# Patient Record
Sex: Female | Born: 1995 | Race: Black or African American | Hispanic: No | Marital: Single | Smoking: Former smoker
Health system: Southern US, Community
[De-identification: ages and names within clinical notes are randomized; demographics above are authoritative.]

## PROBLEM LIST (undated history)

## (undated) DIAGNOSIS — F419 Anxiety disorder, unspecified: Secondary | ICD-10-CM

## (undated) DIAGNOSIS — H409 Unspecified glaucoma: Secondary | ICD-10-CM

## (undated) DIAGNOSIS — F32A Depression, unspecified: Secondary | ICD-10-CM

## (undated) DIAGNOSIS — F329 Major depressive disorder, single episode, unspecified: Secondary | ICD-10-CM

## (undated) HISTORY — PX: OTHER SURGICAL HISTORY: SHX169

---
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
## 2014-01-25 ENCOUNTER — Encounter (HOSPITAL_COMMUNITY): Payer: Self-pay | Admitting: Emergency Medicine

## 2014-01-25 DIAGNOSIS — B9689 Other specified bacterial agents as the cause of diseases classified elsewhere: Secondary | ICD-10-CM | POA: Insufficient documentation

## 2014-01-25 DIAGNOSIS — A499 Bacterial infection, unspecified: Secondary | ICD-10-CM | POA: Insufficient documentation

## 2014-01-25 DIAGNOSIS — J309 Allergic rhinitis, unspecified: Secondary | ICD-10-CM | POA: Insufficient documentation

## 2014-01-25 DIAGNOSIS — N760 Acute vaginitis: Secondary | ICD-10-CM | POA: Insufficient documentation

## 2014-01-25 DIAGNOSIS — Z87891 Personal history of nicotine dependence: Secondary | ICD-10-CM | POA: Insufficient documentation

## 2014-01-25 DIAGNOSIS — Z8669 Personal history of other diseases of the nervous system and sense organs: Secondary | ICD-10-CM | POA: Diagnosis not present

## 2014-01-25 DIAGNOSIS — R300 Dysuria: Secondary | ICD-10-CM | POA: Diagnosis present

## 2014-01-25 LAB — URINE MICROSCOPIC-ADD ON

## 2014-01-25 LAB — URINALYSIS, ROUTINE W REFLEX MICROSCOPIC
BILIRUBIN URINE: NEGATIVE
GLUCOSE, UA: NEGATIVE mg/dL
KETONES UR: NEGATIVE mg/dL
Nitrite: NEGATIVE
PROTEIN: NEGATIVE mg/dL
Specific Gravity, Urine: 1.011 (ref 1.005–1.030)
Urobilinogen, UA: 0.2 mg/dL (ref 0.0–1.0)
pH: 6.5 (ref 5.0–8.0)

## 2014-01-25 NOTE — ED Notes (Signed)
Pt reports facial swelling since Monday. States Monday she had swelling to lips that resolved on its own, today she awoke with swelling to right eye. Pt denies any swelling to tongue or throat. Pt in NAD. Speaks in complete sentences. Pt also reports that she has had urinary frequency and dysuria x 4 days. Denies hematuria. NAD. AO x4.

## 2014-01-26 ENCOUNTER — Emergency Department (HOSPITAL_COMMUNITY)
Admission: EM | Admit: 2014-01-26 | Discharge: 2014-01-26 | Disposition: A | Discharge status: Home / Self Care | Payer: Medicaid Other | Attending: Emergency Medicine | Admitting: Emergency Medicine

## 2014-01-26 DIAGNOSIS — N760 Acute vaginitis: Secondary | ICD-10-CM

## 2014-01-26 DIAGNOSIS — J302 Other seasonal allergic rhinitis: Secondary | ICD-10-CM

## 2014-01-26 MED ORDER — METRONIDAZOLE 500 MG PO TABS: 500.0000 mg | ORAL_TABLET | Freq: Three times a day (TID) | ORAL | Status: DC

## 2014-01-26 MED ORDER — CETIRIZINE HCL 10 MG PO TABS: 10.0000 mg | ORAL_TABLET | Freq: Every day | ORAL | Status: DC

## 2014-01-26 MED ORDER — FLUCONAZOLE 150 MG PO TABS: 150.0000 mg | ORAL_TABLET | Freq: Once | ORAL | Status: DC

## 2014-01-26 MED ORDER — DOXYCYCLINE HYCLATE 100 MG PO CAPS: 100.0000 mg | ORAL_CAPSULE | Freq: Two times a day (BID) | ORAL | Status: DC

## 2014-01-26 NOTE — ED Provider Notes (Signed)
CSN: [id number]     Arrival date & time 01/25/14  1954 History   First MD Initiated Contact with Patient 01/26/14 0012     Chief Complaint  Patient presents with  . Facial Swelling  . Dysuria     (Consider location/radiation/quality/duration/timing/severity/associated sxs/prior Treatment) HPI  Charlotte Douglas is a 18 y.o. female she is here because of swelling in her right thigh and lips, that started several days ago. He feels like her typical allergic reaction. She has not tried anything for it yet. She also reports losing her prescriptions for vaginitis she was given last week. She has recently moved to [place]. She denies fever, chills, nausea, vomiting, cough, shortness of breath, chest pain, or weakness. There are no other known modifying factors.   Past Medical History  Diagnosis Date  . Glaucoma    Past Surgical History  Procedure Laterality Date  . Wisdom teeth removal     No family history on file. History  Substance Use Topics  . Smoking status: Former Smoker    Quit date: 06/30/2013  . Smokeless tobacco: Not on file  . Alcohol Use: No   OB History   Grav Para Term Preterm Abortions TAB SAB Ect Mult Living                 Review of Systems  All other systems reviewed and are negative.     Allergies  Review of patient's allergies indicates no known allergies.  Home Medications   Prior to Admission medications   Medication Sig Start Date End Date Taking? Authorizing Provider  cetirizine (ZYRTEC ALLERGY) 10 MG tablet Take 1 tablet (10 mg total) by mouth daily. For allergies 01/26/14   Flint Melter, MD  doxycycline (VIBRAMYCIN) 100 MG capsule Take 1 capsule (100 mg total) by mouth 2 (two) times daily. 01/26/14   Flint Melter, MD  fluconazole (DIFLUCAN) 150 MG tablet Take 1 tablet (150 mg total) by mouth once. 01/26/14   Flint Melter, MD  metroNIDAZOLE (FLAGYL) 500 MG tablet Take 1 tablet (500 mg total) by mouth 3 (three) times daily. 01/26/14    Flint Melter, MD   BP 109/62  Pulse 98  Temp(Src) 99.2 F (37.3 C) (Oral)  Resp 18  SpO2 99%  LMP 01/11/2014 Physical Exam  Nursing note and vitals reviewed. Constitutional: She is oriented to person, place, and time. She appears well-developed and well-nourished.  HENT:  Head: Normocephalic and atraumatic.  No oral or lingual angioedema  Eyes: Conjunctivae and EOM are normal. Pupils are equal, round, and reactive to light.  Mild right eye redness without discharge  Neck: Normal range of motion and phonation normal. Neck supple.  Cardiovascular: Normal rate, regular rhythm and intact distal pulses.   Pulmonary/Chest: Effort normal and breath sounds normal. She exhibits no tenderness.  Abdominal: Soft. She exhibits no distension. There is no tenderness. There is no guarding.  Musculoskeletal: Normal range of motion.  Neurological: She is alert and oriented to person, place, and time. She exhibits normal muscle tone.  Skin: Skin is warm and dry.  Mild angioedema of the right periorbital region.  Psychiatric: She has a normal mood and affect. Her behavior is normal. Judgment and thought content normal.    ED Course  Procedures (including critical care time) Labs Review Labs Reviewed  URINALYSIS, ROUTINE W REFLEX MICROSCOPIC - Abnormal; Notable for the following:    APPearance CLOUDY (*)    Hgb urine dipstick TRACE (*)    Leukocytes, UA  TRACE (*)    All other components within normal limits  URINE MICROSCOPIC-ADD ON - Abnormal; Notable for the following:    Squamous Epithelial / LPF MANY (*)    All other components within normal limits    Imaging Review No results found.   EKG Interpretation None      MDM   Final diagnoses:  Seasonal allergic reaction  Vaginitis    Evaluation is consistent with seasonal allergies. She also is treated for vaginitis, since she lost her prescriptions.  Nursing Notes Reviewed/ Care Coordinated Applicable Imaging  Reviewed Interpretation of Laboratory Data incorporated into ED treatment  The patient appears reasonably screened and/or stabilized for discharge and I doubt any other medical condition or other [facility] requiring further screening, evaluation, or treatment in the ED at this time prior to discharge.  Plan: Home Medications- Zyrtec, Diflucan, Doxycycline, Flagyl; Home Treatments- no Sae; return here if the recommended treatment, does not improve the symptoms; Recommended follow up- PCP prn     Flint MelterElliott L Darlis Wragg, MD 01/26/14 [phone]

## 2014-01-26 NOTE — ED Notes (Signed)
Pt. Right eye and right side of face swollen. Denies SOB or difficulty breathing. Airway intact. Alert and oriented x4.

## 2014-01-26 NOTE — Discharge Instructions (Signed)
Allergies  Allergies may happen from anything your body is sensitive to. This may be food, medicines, pollens, chemicals, and many other things. Food allergies can be severe and deadly.  HOME CARE  If you do not know what causes a reaction, keep a diary. Write down the foods you ate and the symptoms that followed. Avoid foods that cause reactions.  If you have red raised spots (hives) or a rash:  Take medicine as told by your doctor.  Use medicines for red raised spots and itching as needed.  Apply cold cloths (compresses) to the skin. Take a cool bath. Avoid hot baths or showers.  If you are severely allergic:  It is often necessary to go to the hospital after you have treated your reaction.  Wear your medical alert jewelry.  You and your family must learn how to give a allergy shot or use an allergy kit (anaphylaxis kit).  Always carry your allergy kit or shot with you. Use this medicine as told by your doctor if a severe reaction is occurring. GET HELP RIGHT AWAY IF:  You have trouble breathing or are making high-pitched whistling sounds (wheezing).  You have a tight feeling in your chest or throat.  You have a puffy (swollen) mouth.  You have red raised spots, puffiness (swelling), or itching all over your body.  You have had a severe reaction that was helped by your allergy kit or shot. The reaction can return once the medicine has worn off.  You think you are having a food allergy. Symptoms most often happen within 30 minutes of eating a food.  Your symptoms have not gone away within 2 days or are getting worse.  You have new symptoms.  You want to retest yourself with a food or drink you think causes an allergic reaction. Only do this under the care of a doctor. MAKE SURE YOU:   Understand these instructions.  Will watch your condition.  Will get help right away if you are not doing well or get worse. Document Released: 09/19/2012 Document Reviewed:  09/19/2012 [facility] Patient Information 2015 [place]. This information is not intended to replace advice given to you by your health care provider. Make sure you discuss any questions you have with your health care provider.  Vaginitis Vaginitis is an inflammation of the vagina. It is most often caused by a change in the normal balance of the bacteria and yeast that live in the vagina. This change in balance causes an overgrowth of certain bacteria or yeast, which causes the inflammation. There are different types of vaginitis, but the most common types are:  Bacterial vaginosis.  Yeast infection (candidiasis).  Trichomoniasis vaginitis. This is a sexually transmitted infection (STI).  Viral vaginitis.  Atropic vaginitis.  Allergic vaginitis. CAUSES  The cause depends on the type of vaginitis. Vaginitis can be caused by:  Bacteria (bacterial vaginosis).  Yeast (yeast infection).  A parasite (trichomoniasis vaginitis)  A virus (viral vaginitis).  Low hormone levels (atrophic vaginitis). Low hormone levels can occur during pregnancy, breastfeeding, or after menopause.  Irritants, such as bubble baths, scented tampons, and feminine sprays (allergic vaginitis). Other factors can change the normal balance of the yeast and bacteria that live in the vagina. These include:  Antibiotic medicines.  Poor hygiene.  Diaphragms, vaginal sponges, spermicides, birth control pills, and intrauterine devices (IUD).  Sexual intercourse.  Infection.  Uncontrolled diabetes.  A weakened immune system. SYMPTOMS  Symptoms can vary depending on the cause of the  vaginitis. Common symptoms include:  Abnormal vaginal discharge.  The discharge is white, gray, or yellow with bacterial vaginosis.  The discharge is thick, white, and cheesy with a yeast infection.  The discharge is frothy and yellow or greenish with trichomoniasis.  A bad vaginal odor.  The odor is fishy with  bacterial vaginosis.  Vaginal itching, pain, or swelling.  Painful intercourse.  Pain or burning when urinating. Sometimes, there are no symptoms. TREATMENT  Treatment will vary depending on the type of infection.   Bacterial vaginosis and trichomoniasis are often treated with antibiotic creams or pills.  Yeast infections are often treated with antifungal medicines, such as vaginal creams or suppositories.  Viral vaginitis has no cure, but symptoms can be treated with medicines that relieve discomfort. Your sexual partner should be treated as well.  Atrophic vaginitis may be treated with an estrogen cream, pill, suppository, or vaginal ring. If vaginal dryness occurs, lubricants and moisturizing creams may help. You may be told to avoid scented soaps, sprays, or douches.  Allergic vaginitis treatment involves quitting the use of the product that is causing the problem. Vaginal creams can be used to treat the symptoms. HOME CARE INSTRUCTIONS   Take all medicines as directed by your caregiver.  Keep your genital area clean and dry. Avoid soap and only rinse the area with water.  Avoid douching. It can remove the healthy bacteria in the vagina.  Do not use tampons or have sexual intercourse until your vaginitis has been treated. Use sanitary pads while you have vaginitis.  Wipe from front to back. This avoids the spread of bacteria from the rectum to the vagina.  Let air reach your genital area.  Wear cotton underwear to decrease moisture buildup.  Avoid wearing underwear while you sleep until your vaginitis is gone.  Avoid tight pants and underwear or nylons without a cotton panel.  Take off wet clothing (especially bathing suits) as soon as possible.  Use mild, non-scented products. Avoid using irritants, such as:  Scented feminine sprays.  Fabric softeners.  Scented detergents.  Scented tampons.  Scented soaps or bubble baths.  Practice safe sex and use condoms.  Condoms may prevent the spread of trichomoniasis and viral vaginitis. SEEK MEDICAL CARE IF:   You have abdominal pain.  You have a fever or persistent symptoms for more than 2-3 days.  You have a fever and your symptoms suddenly get worse. Document Released: 03/22/2007 Document Revised: 02/17/2012 Document Reviewed: 11/05/2011 [facility] Patient Information 2015 [place], [place]. This information is not intended to replace advice given to you by your health care provider. Make sure you discuss any questions you have with your health care provider.

## 2014-04-04 ENCOUNTER — Emergency Department (HOSPITAL_COMMUNITY)
Admission: EM | Admit: 2014-04-04 | Discharge: 2014-04-04 | Disposition: A | Discharge status: Home / Self Care | Payer: Medicaid Other | Attending: Emergency Medicine | Admitting: Emergency Medicine

## 2014-04-04 ENCOUNTER — Encounter (HOSPITAL_COMMUNITY): Payer: Self-pay | Admitting: Emergency Medicine

## 2014-04-04 DIAGNOSIS — F419 Anxiety disorder, unspecified: Secondary | ICD-10-CM | POA: Diagnosis not present

## 2014-04-04 DIAGNOSIS — R112 Nausea with vomiting, unspecified: Secondary | ICD-10-CM | POA: Diagnosis present

## 2014-04-04 DIAGNOSIS — F329 Major depressive disorder, single episode, unspecified: Secondary | ICD-10-CM | POA: Insufficient documentation

## 2014-04-04 DIAGNOSIS — Z79899 Other long term (current) drug therapy: Secondary | ICD-10-CM | POA: Diagnosis not present

## 2014-04-04 DIAGNOSIS — Z8669 Personal history of other diseases of the nervous system and sense organs: Secondary | ICD-10-CM | POA: Diagnosis not present

## 2014-04-04 DIAGNOSIS — Z3202 Encounter for pregnancy test, result negative: Secondary | ICD-10-CM | POA: Diagnosis not present

## 2014-04-04 DIAGNOSIS — Z87891 Personal history of nicotine dependence: Secondary | ICD-10-CM | POA: Diagnosis not present

## 2014-04-04 DIAGNOSIS — N12 Tubulo-interstitial nephritis, not specified as acute or chronic: Secondary | ICD-10-CM | POA: Insufficient documentation

## 2014-04-04 HISTORY — DX: Depression, unspecified: F32.A

## 2014-04-04 HISTORY — DX: Anxiety disorder, unspecified: F41.9

## 2014-04-04 HISTORY — DX: Major depressive disorder, single episode, unspecified: F32.9

## 2014-04-04 LAB — CBC WITH DIFFERENTIAL/PLATELET
BASOS ABS: 0 10*3/uL (ref 0.0–0.1)
BASOS PCT: 0 % (ref 0–1)
Eosinophils Absolute: 0 10*3/uL (ref 0.0–0.7)
Eosinophils Relative: 0 % (ref 0–5)
HEMATOCRIT: 36.3 % (ref 36.0–46.0)
HEMOGLOBIN: 12.6 g/dL (ref 12.0–15.0)
Lymphocytes Relative: 10 % — ABNORMAL LOW (ref 12–46)
Lymphs Abs: 1.5 10*3/uL (ref 0.7–4.0)
MCH: 25.7 pg — ABNORMAL LOW (ref 26.0–34.0)
MCHC: 34.7 g/dL (ref 30.0–36.0)
MCV: 73.9 fL — AB (ref 78.0–100.0)
MONOS PCT: 8 % (ref 3–12)
Monocytes Absolute: 1.2 10*3/uL — ABNORMAL HIGH (ref 0.1–1.0)
NEUTROS ABS: 12.6 10*3/uL — AB (ref 1.7–7.7)
Neutrophils Relative %: 82 % — ABNORMAL HIGH (ref 43–77)
Platelets: 392 10*3/uL (ref 150–400)
RBC: 4.91 MIL/uL (ref 3.87–5.11)
RDW: 14.8 % (ref 11.5–15.5)
WBC: 15.3 10*3/uL — AB (ref 4.0–10.5)

## 2014-04-04 LAB — LIPASE, BLOOD: Lipase: 18 U/L (ref 11–59)

## 2014-04-04 LAB — COMPREHENSIVE METABOLIC PANEL
ALBUMIN: 3.5 g/dL (ref 3.5–5.2)
ALT: 17 U/L (ref 0–35)
ANION GAP: 11 (ref 5–15)
AST: 16 U/L (ref 0–37)
Alkaline Phosphatase: 99 U/L (ref 39–117)
BILIRUBIN TOTAL: 0.8 mg/dL (ref 0.3–1.2)
BUN: 5 mg/dL — AB (ref 6–23)
CHLORIDE: 99 meq/L (ref 96–112)
CO2: 25 mEq/L (ref 19–32)
CREATININE: 0.71 mg/dL (ref 0.50–1.10)
Calcium: 9.3 mg/dL (ref 8.4–10.5)
GFR calc Af Amer: >90 mL/min (ref >90)
GFR calc non Af Amer: >90 mL/min (ref >90)
Glucose, Bld: 109 mg/dL — ABNORMAL HIGH (ref 70–99)
Potassium: 4.1 mEq/L (ref 3.7–5.3)
Sodium: 135 mEq/L — ABNORMAL LOW (ref 137–147)
TOTAL PROTEIN: 7.6 g/dL (ref 6.0–8.3)

## 2014-04-04 LAB — POC URINE PREG, ED: PREG TEST UR: NEGATIVE

## 2014-04-04 LAB — URINALYSIS, ROUTINE W REFLEX MICROSCOPIC
Glucose, UA: NEGATIVE mg/dL
KETONES UR: NEGATIVE mg/dL
NITRITE: POSITIVE — AB
Protein, ur: 30 mg/dL — AB
Specific Gravity, Urine: 1.024 (ref 1.005–1.030)
UROBILINOGEN UA: 1 mg/dL (ref 0.0–1.0)
pH: 8 (ref 5.0–8.0)

## 2014-04-04 LAB — URINE MICROSCOPIC-ADD ON

## 2014-04-04 MED ORDER — MORPHINE SULFATE 4 MG/ML IJ SOLN
4.0000 mg | Freq: Once | INTRAMUSCULAR | Status: AC
  Administered 2014-04-04: 4 mg via INTRAVENOUS
  Filled 2014-04-04: qty 1

## 2014-04-04 MED ORDER — SODIUM CHLORIDE 0.9 % IV BOLUS (SEPSIS)
1000.0000 mL | Freq: Once | INTRAVENOUS | Status: AC
  Administered 2014-04-04: 1000 mL via INTRAVENOUS

## 2014-04-04 MED ORDER — OXYCODONE-ACETAMINOPHEN 5-325 MG PO TABS
1.0000 | ORAL_TABLET | Freq: Once | ORAL | Status: AC
  Administered 2014-04-04: 1 via ORAL
  Filled 2014-04-04: qty 1

## 2014-04-04 MED ORDER — OXYCODONE-ACETAMINOPHEN 5-325 MG PO TABS: 1.0000 | ORAL_TABLET | Freq: Four times a day (QID) | ORAL | Status: AC | PRN

## 2014-04-04 MED ORDER — HYDROMORPHONE HCL 1 MG/ML IJ SOLN: 1.0000 mg | Freq: Once | INTRAMUSCULAR | Status: DC

## 2014-04-04 MED ORDER — ONDANSETRON HCL 4 MG/2ML IJ SOLN
4.0000 mg | Freq: Once | INTRAMUSCULAR | Status: AC
  Administered 2014-04-04: 4 mg via INTRAVENOUS
  Filled 2014-04-04: qty 2

## 2014-04-04 MED ORDER — ONDANSETRON HCL 4 MG PO TABS: 4.0000 mg | ORAL_TABLET | Freq: Four times a day (QID) | ORAL | Status: AC

## 2014-04-04 MED ORDER — CIPROFLOXACIN HCL 500 MG PO TABS: 500.0000 mg | ORAL_TABLET | Freq: Two times a day (BID) | ORAL | Status: AC

## 2014-04-04 MED ORDER — IBUPROFEN 800 MG PO TABS: 800.0000 mg | ORAL_TABLET | Freq: Three times a day (TID) | ORAL | Status: AC

## 2014-04-04 MED ORDER — ONDANSETRON HCL 4 MG/2ML IJ SOLN
4.0000 mg | Freq: Once | INTRAMUSCULAR | Status: AC
Start: 2014-04-04 — End: 2014-04-04
  Administered 2014-04-04: 4 mg via INTRAVENOUS
  Filled 2014-04-04: qty 2

## 2014-04-04 MED ORDER — DEXTROSE 5 % IV SOLN
1.0000 g | Freq: Once | INTRAVENOUS | Status: AC
  Administered 2014-04-04: 1 g via INTRAVENOUS
  Filled 2014-04-04: qty 10

## 2014-04-04 MED ORDER — SODIUM CHLORIDE 0.9 % IV BOLUS (SEPSIS)
1000.0000 mL | Freq: Once | INTRAVENOUS | Status: DC
Start: 2014-04-04 — End: 2014-04-04

## 2014-04-04 MED ORDER — KETOROLAC TROMETHAMINE 30 MG/ML IJ SOLN
30.0000 mg | Freq: Once | INTRAMUSCULAR | Status: AC
  Administered 2014-04-04: 30 mg via INTRAVENOUS
  Filled 2014-04-04: qty 1

## 2014-04-04 MED ORDER — MORPHINE SULFATE 4 MG/ML IJ SOLN
4.0000 mg | Freq: Once | INTRAMUSCULAR | Status: DC
Start: 2014-04-04 — End: 2014-04-04

## 2014-04-04 NOTE — ED Notes (Signed)
Bed: [id number] Expected date:  Expected time:  Means of arrival:  Comments: EMS abd. Pain/n/v

## 2014-04-04 NOTE — ED Provider Notes (Signed)
CSN: [id number]     Arrival date & time 04/04/14  1157 History   First MD Initiated Contact with Patient 04/04/14 1203     Chief Complaint  Patient presents with  . Abdominal Pain  . Nausea  . Emesis   (Consider location/radiation/quality/duration/timing/severity/associated sxs/prior Treatment) HPI  PT to the ED by EMS for severe suprapubic abdominal pains, nausea and vomiting. She says the symptoms started yesterday and have become severe. She is also have some mild urinary incontinence. She reports that she gets frequent UTIs but has never felt this kind of pain before. She has a temp of 100, pulse 015, BP of 116/48. She is in distress due to severe pain. Her chart says that she has hypertension and diabetes but she denies, takes Prozac and Trazodone only.  Pt denies fevers, headache, neck pain, back pain, CP, SOB, cough, N/V/D, Abdominal pains, dysuria, vaginal discharge/bleeding, weakness, fatigue, confusion.  Past Medical History  Diagnosis Date  . Glaucoma   . Depression   . Anxiety    Past Surgical History  Procedure Laterality Date  . Wisdom teeth removal     No family history on file. History  Substance Use Topics  . Smoking status: Former Smoker    Quit date: 06/30/2013  . Smokeless tobacco: Not on file  . Alcohol Use: No   OB History   Grav Para Term Preterm Abortions TAB SAB Ect Mult Living                 Review of Systems  10 Systems reviewed and are negative for acute change except as noted in the HPI.   Allergies  Review of patient's allergies indicates no known allergies.  Home Medications   Prior to Admission medications   Medication Sig Start Date End Date Taking? Authorizing Provider  FLUoxetine (PROZAC) 20 MG capsule Take 20 mg by mouth daily.   Yes Historical Provider, MD  traZODone (DESYREL) 50 MG tablet Take 50 mg by mouth at bedtime.   Yes Historical Provider, MD  ciprofloxacin (CIPRO) 500 MG tablet Take 1 tablet (500 mg total) by mouth 2  (two) times daily. 04/04/14   Dillon Livermore Irine SealG Kobi Mario, PA-C  ibuprofen (ADVIL,MOTRIN) 800 MG tablet Take 1 tablet (800 mg total) by mouth 3 (three) times daily. 04/04/14   Saoirse Legere Irine SealG Queenie Aufiero, PA-C  ondansetron (ZOFRAN) 4 MG tablet Take 1 tablet (4 mg total) by mouth every 6 (six) hours. 04/04/14   Tekoa Hamor Irine SealG Nialah Saravia, PA-C  oxyCODONE-acetaminophen (PERCOCET/ROXICET) 5-325 MG per tablet Take 1 tablet by mouth every 6 (six) hours as needed for severe pain (For break through pain after taking Ibuprofen). 04/04/14   Ellsie Violette Irine SealG Aarian Griffie, PA-C   BP 108/59  Pulse 91  Temp(Src) 98.7 F (37.1 C) (Oral)  Resp 20  SpO2 95%  LMP 04/01/2014 Physical Exam  Nursing note and vitals reviewed. Constitutional: She appears well-developed and well-nourished. She appears distressed (pain).  HENT:  Head: Normocephalic and atraumatic.  Eyes: Pupils are equal, round, and reactive to light.  Neck: Normal range of motion. Neck supple.  Cardiovascular: Normal rate and regular rhythm.   Pulmonary/Chest: Effort normal.  Abdominal: Soft. Bowel sounds are normal. She exhibits no distension. There is tenderness in the suprapubic area. There is guarding. There is no rigidity, no rebound, no CVA tenderness and no tenderness at McBurney's point.  Neurological: She is alert.  Skin: Skin is warm and dry.   ED Course  Procedures (including critical care time) Labs Review Labs Reviewed  URINALYSIS, ROUTINE W REFLEX MICROSCOPIC - Abnormal; Notable for the following:    Color, Urine AMBER (*)    APPearance TURBID (*)    Hgb urine dipstick LARGE (*)    Bilirubin Urine SMALL (*)    Protein, ur 30 (*)    Nitrite POSITIVE (*)    Leukocytes, UA LARGE (*)    All other components within normal limits  COMPREHENSIVE METABOLIC PANEL - Abnormal; Notable for the following:    Sodium 135 (*)    Glucose, Bld 109 (*)    BUN 5 (*)    All other components within normal limits  CBC WITH DIFFERENTIAL - Abnormal; Notable for the following:    WBC  15.3 (*)    MCV 73.9 (*)    MCH 25.7 (*)    Neutrophils Relative % 82 (*)    Lymphocytes Relative 10 (*)    Neutro Abs 12.6 (*)    Monocytes Absolute 1.2 (*)    All other components within normal limits  URINE MICROSCOPIC-ADD ON - Abnormal; Notable for the following:    Bacteria, UA MANY (*)    Casts GRANULAR CAST (*)    All other components within normal limits  URINE CULTURE  LIPASE, BLOOD  POC URINE PREG, ED    Imaging Review No results found.   EKG Interpretation None      MDM   Final diagnoses:  Pyelonephritis    Medications  sodium chloride 0.9 % bolus 1,000 mL (0 mLs Intravenous Stopped 04/04/14 1429)  morphine 4 MG/ML injection 4 mg (4 mg Intravenous Given 04/04/14 1302)  ondansetron (ZOFRAN) injection 4 mg (4 mg Intravenous Given 04/04/14 1302)  cefTRIAXone (ROCEPHIN) 1 g in dextrose 5 % 50 mL IVPB (0 g Intravenous Stopped 04/04/14 1556)  ondansetron (ZOFRAN) injection 4 mg (4 mg Intravenous Given 04/04/14 1429)  ketorolac (TORADOL) 30 MG/ML injection 30 mg (30 mg Intravenous Given 04/04/14 1429)  morphine 4 MG/ML injection 4 mg (4 mg Intravenous Given 04/04/14 1429)  oxyCODONE-acetaminophen (PERCOCET/ROXICET) 5-325 MG per tablet 1 tablet (1 tablet Oral Given 04/04/14 1557)    Patient pain initially difficult to control in the ED. But after the Toradol it improved significantly. She had two cups of water and requested orange juice. All of which she kept down without vomiting with no difficulty. Clinically she has pyelonephritis. Her temp was 100 degrees initially.  Pt is young and otherwise healthy. She should do well at home. She has an elevated white count, normal lipase, CMP shows no emergent abnormalities.   Rx: ciprofloxacin (CIPRO) 500 MG tablet ibuprofen (ADVIL,MOTRIN) 800 MG tablet ondansetron (ZOFRAN) 4 MG tablet oxyCODONE-acetaminophen (PERCOCET/ROXICET) 5-325 MG per tablet  Given a referral to Urology. Given education on how to prevent further  UTIs.  18 y.o.Charlotte Douglas's evaluation in the Emergency Department is complete. It has been determined that no acute conditions requiring further emergency intervention are present at this time. The patient/guardian have been advised of the diagnosis and plan. We have discussed signs and symptoms that warrant return to the ED, such as changes or worsening in symptoms.  Vital signs are stable at discharge. Filed Vitals:   04/04/14 1616  BP: 108/59  Pulse: 91  Temp: 98.7 F (37.1 C)  Resp: 20    Patient/guardian has voiced understanding and agreed to follow-up with the PCP or specialist.       Dorthula Matasiffany G Janyce Ellinger, PA-C 04/04/14 1637

## 2014-04-04 NOTE — ED Notes (Signed)
Per [facility]-- Pt c/o of stomach pain lower x 2 days. N/V without fever started today. C/o of urinary problems.

## 2014-04-04 NOTE — ED Notes (Signed)
Pt notifying for ride home.

## 2014-04-04 NOTE — ED Notes (Signed)
MD at bedside. EDPA TIFFANY PRESENT TO RE EVALUATE THIS PT

## 2014-04-04 NOTE — Discharge Instructions (Signed)
Pyelonephritis, Adult °Pyelonephritis is a kidney infection. In general, there are 2 main types of pyelonephritis: °· Infections that come on quickly without any warning (acute pyelonephritis). °· Infections that persist for a long period of time (chronic pyelonephritis). °CAUSES  °Two main causes of pyelonephritis are: °· Bacteria traveling from the bladder to the kidney. This is a problem especially in pregnant women. The urine in the bladder can become filled with bacteria from multiple causes, including: °¨ Inflammation of the prostate gland (prostatitis). °¨ Sexual intercourse in females. °¨ Bladder infection (cystitis). °· Bacteria traveling from the bloodstream to the tissue part of the kidney. °Problems that may increase your risk of getting a kidney infection include: °· Diabetes. °· Kidney stones or bladder stones. °· Cancer. °· Catheters placed in the bladder. °· Other abnormalities of the kidney or ureter. °SYMPTOMS  °· Abdominal pain. °· Pain in the side or flank area. °· Fever. °· Chills. °· Upset stomach. °· Blood in the urine (dark urine). °· Frequent urination. °· Strong or persistent urge to urinate. °· Burning or stinging when urinating. °DIAGNOSIS  °Your caregiver may diagnose your kidney infection based on your symptoms. A urine sample may also be taken. °TREATMENT  °In general, treatment depends on how severe the infection is.  °· If the infection is mild and caught early, your caregiver may treat you with oral antibiotics and send you home. °· If the infection is more severe, the bacteria may have gotten into the bloodstream. This will require intravenous (IV) antibiotics and a hospital stay. Symptoms may include: °¨ High fever. °¨ Severe flank pain. °¨ Shaking chills. °· Even after a hospital stay, your caregiver may require you to be on oral antibiotics for a period of time. °· Other treatments may be required depending upon the cause of the infection. °HOME CARE INSTRUCTIONS  °· Take your  antibiotics as directed. Finish them even if you start to feel better. °· Make an appointment to have your urine checked to make sure the infection is gone. °· Drink enough fluids to keep your urine clear or pale yellow. °· Take medicines for the bladder if you have urgency and frequency of urination as directed by your caregiver. °SEEK IMMEDIATE MEDICAL CARE IF:  °· You have a fever or persistent symptoms for more than 2-3 days. °· You have a fever and your symptoms suddenly get worse. °· You are unable to take your antibiotics or fluids. °· You develop shaking chills. °· You experience extreme weakness or fainting. °· There is no improvement after 2 days of treatment. °MAKE SURE YOU: °· Understand these instructions. °· Will watch your condition. °· Will get help right away if you are not doing well or get worse. °Document Released: 05/25/2005 Document Revised: 11/24/2011 Document Reviewed: 10/29/2010 °ExitCare® Patient Information ©2015 [employer]. This information is not intended to replace advice given to you by your health care provider. Make sure you discuss any questions you have with your health care provider. ° °

## 2014-04-04 NOTE — ED Notes (Signed)
Pt has cell phone, charge, purse and glasses with her at d/c

## 2014-04-05 LAB — URINE CULTURE: Special Requests: NORMAL

## 2014-04-05 NOTE — ED Provider Notes (Signed)
Medical screening examination/treatment/procedure(s) were performed by non-physician practitioner and as supervising physician I was immediately available for consultation/collaboration.   EKG Interpretation None        Samuel JesterKathleen Haydin Dunn, DO 04/05/14 0745

## 2014-04-12 ENCOUNTER — Encounter (HOSPITAL_COMMUNITY): Payer: Self-pay | Admitting: Emergency Medicine

## 2014-04-12 DIAGNOSIS — Z79899 Other long term (current) drug therapy: Secondary | ICD-10-CM | POA: Insufficient documentation

## 2014-04-12 DIAGNOSIS — R350 Frequency of micturition: Secondary | ICD-10-CM | POA: Diagnosis present

## 2014-04-12 DIAGNOSIS — F419 Anxiety disorder, unspecified: Secondary | ICD-10-CM | POA: Insufficient documentation

## 2014-04-12 DIAGNOSIS — Z792 Long term (current) use of antibiotics: Secondary | ICD-10-CM | POA: Insufficient documentation

## 2014-04-12 DIAGNOSIS — B372 Candidiasis of skin and nail: Secondary | ICD-10-CM | POA: Diagnosis not present

## 2014-04-12 DIAGNOSIS — Z87891 Personal history of nicotine dependence: Secondary | ICD-10-CM | POA: Insufficient documentation

## 2014-04-12 DIAGNOSIS — F329 Major depressive disorder, single episode, unspecified: Secondary | ICD-10-CM | POA: Insufficient documentation

## 2014-04-12 DIAGNOSIS — Z3202 Encounter for pregnancy test, result negative: Secondary | ICD-10-CM | POA: Insufficient documentation

## 2014-04-12 DIAGNOSIS — Z8669 Personal history of other diseases of the nervous system and sense organs: Secondary | ICD-10-CM | POA: Insufficient documentation

## 2014-04-12 NOTE — ED Notes (Signed)
Pt to ED via [facility] for evaluation of painful urination and "bumps to vagina".  [facility]

## 2014-04-13 ENCOUNTER — Emergency Department (HOSPITAL_COMMUNITY)
Admission: EM | Admit: 2014-04-13 | Discharge: 2014-04-13 | Disposition: A | Discharge status: Home / Self Care | Payer: Medicaid Other | Attending: Emergency Medicine | Admitting: Emergency Medicine

## 2014-04-13 DIAGNOSIS — B372 Candidiasis of skin and nail: Secondary | ICD-10-CM

## 2014-04-13 LAB — URINALYSIS, ROUTINE W REFLEX MICROSCOPIC
BILIRUBIN URINE: NEGATIVE
Glucose, UA: NEGATIVE mg/dL
Hgb urine dipstick: NEGATIVE
Ketones, ur: 40 mg/dL — AB
NITRITE: NEGATIVE
PH: 5.5 (ref 5.0–8.0)
Protein, ur: NEGATIVE mg/dL
Specific Gravity, Urine: 1.03 (ref 1.005–1.030)
Urobilinogen, UA: 0.2 mg/dL (ref 0.0–1.0)

## 2014-04-13 LAB — WET PREP, GENITAL
Clue Cells Wet Prep HPF POC: NONE SEEN
Trich, Wet Prep: NONE SEEN
Yeast Wet Prep HPF POC: NONE SEEN

## 2014-04-13 LAB — URINE MICROSCOPIC-ADD ON

## 2014-04-13 LAB — PREGNANCY, URINE: PREG TEST UR: NEGATIVE

## 2014-04-13 MED ORDER — NYSTATIN 100000 UNIT/GM EX POWD: CUTANEOUS | Status: AC

## 2014-04-13 NOTE — ED Provider Notes (Signed)
CSN: [id number]     Arrival date & time 04/12/14  2021 History   First MD Initiated Contact with Patient 04/13/14 0057     Chief Complaint  Patient presents with  . Urinary Frequency     (Consider location/radiation/quality/duration/timing/severity/associated sxs/prior Treatment) HPI Patient presents with several days of dysuria. She states [facility] the [facility] 2 days as well. She was started on several medications. When she was initially seen she was started on Valtrex for possible herpes outbreak. She also was given Flagyl for bacterial vaginosis. She was seen yesterday after being diagnosed with Chlamydia and started on azithromycin. Patient states she has yet to take the azithromycin. She continues to have a rash in the groin area that burns. She was recently seen in the emergency department and treated for pyelonephritis. She denies any vaginal discharge. No fever or chills. Denies abdominal pain or nausea or vomiting. Past Medical History  Diagnosis Date  . Glaucoma   . Depression   . Anxiety    Past Surgical History  Procedure Laterality Date  . Wisdom teeth removal     No family history on file. History  Substance Use Topics  . Smoking status: Former Smoker    Quit date: 06/30/2013  . Smokeless tobacco: Not on file  . Alcohol Use: No   OB History    No data available     Review of Systems  Constitutional: Negative for fever and chills.  Respiratory: Negative for shortness of breath.   Cardiovascular: Negative for chest pain.  Gastrointestinal: Negative for nausea, vomiting and abdominal pain.  Genitourinary: Positive for dysuria. Negative for frequency, hematuria, flank pain, vaginal bleeding, vaginal discharge, difficulty urinating and pelvic pain.  Musculoskeletal: Negative for myalgias and back pain.  Skin: Positive for rash.  Neurological: Negative for dizziness, weakness, light-headedness, numbness and headaches.  All other  systems reviewed and are negative.     Allergies  Review of patient's allergies indicates no known allergies.  Home Medications   Prior to Admission medications   Medication Sig Start Date End Date Taking? Authorizing Provider  azithromycin (ZITHROMAX) 500 MG tablet Take 1,000 mg by mouth once.   Yes Historical Provider, MD  FLUoxetine (PROZAC) 20 MG capsule Take 20 mg by mouth daily.   Yes Historical Provider, MD  ibuprofen (ADVIL,MOTRIN) 800 MG tablet Take 1 tablet (800 mg total) by mouth 3 (three) times daily. 04/04/14  Yes Tiffany Irine SealG Greene, PA-C  metroNIDAZOLE (FLAGYL) 500 MG tablet Take 500 mg by mouth 2 (two) times daily. x7 days   Yes Historical Provider, MD  traZODone (DESYREL) 50 MG tablet Take 50 mg by mouth at bedtime.   Yes Historical Provider, MD  valACYclovir (VALTREX) 1000 MG tablet Take 1,000 mg by mouth 2 (two) times daily.   Yes Historical Provider, MD  ciprofloxacin (CIPRO) 500 MG tablet Take 1 tablet (500 mg total) by mouth 2 (two) times daily. 04/04/14   Tiffany Irine SealG Greene, PA-C  ondansetron (ZOFRAN) 4 MG tablet Take 1 tablet (4 mg total) by mouth every 6 (six) hours. 04/04/14   Tiffany Irine SealG Greene, PA-C  oxyCODONE-acetaminophen (PERCOCET/ROXICET) 5-325 MG per tablet Take 1 tablet by mouth every 6 (six) hours as needed for severe pain (For break through pain after taking Ibuprofen). 04/04/14   Tiffany Irine SealG Greene, PA-C   BP 111/56 mmHg  Pulse 60  Temp(Src) 98.2 F (36.8 C) (Oral)  Resp 16  Ht 5\' 6"  (1.676 m)  Wt 258  lb (117.028 kg)  BMI 41.66 kg/m2  SpO2 99%  LMP 04/01/2014 Physical Exam  Constitutional: She is oriented to person, place, and time. She appears well-developed and well-nourished. No distress.  Well-appearing  HENT:  Head: Normocephalic and atraumatic.  Mouth/Throat: Oropharynx is clear and moist.  Eyes: EOM are normal. Pupils are equal, round, and reactive to light.  Neck: Normal range of motion. Neck supple.  Cardiovascular: Normal rate and regular  rhythm.   Pulmonary/Chest: Effort normal and breath sounds normal. No respiratory distress. She has no wheezes. She has no rales.  Abdominal: Soft. Bowel sounds are normal. She exhibits no distension and no mass. There is no tenderness. There is no rebound and no guarding.  Genitourinary:  Intertriginous rash to the vulva and intergluteal areas. Erythematous with scattered pustules. Patient also has a thick white vaginal discharge. No cervical motion tenderness.  Musculoskeletal: Normal range of motion. She exhibits no edema or tenderness.  No CVA tenderness bilaterally.  Neurological: She is alert and oriented to person, place, and time.  Skin: Skin is warm and dry. No rash noted. No erythema.  Psychiatric: She has a normal mood and affect. Her behavior is normal.  Nursing note and vitals reviewed.   ED Course  Procedures (including critical care time) Labs Review Labs Reviewed  URINALYSIS, ROUTINE W REFLEX MICROSCOPIC  PREGNANCY, URINE    Imaging Review No results found.   EKG Interpretation None      MDM   Final diagnoses:  None      We'll treat for candidiasis intertrigo. Patient does continue her current antibiotics. She's also been advised to follow-up with the student [facility] to assure resolution of her symptoms. Return precautions have been given and the patient has voiced understanding.  Loren Raceravid Bronson Bressman, MD 04/13/14 [phone]

## 2014-04-13 NOTE — Discharge Instructions (Signed)
Cutaneous Candidiasis Cutaneous candidiasis is a condition in which there is an overgrowth of yeast (candida) on the skin. Yeast normally live on the skin, but in small enough numbers not to cause any symptoms. In certain cases, increased growth of the yeast may cause an actual yeast infection. This kind of infection usually occurs in areas of the skin that are constantly warm and moist, such as the armpits or the groin. Yeast is the most common cause of diaper rash in babies and in people who cannot control their bowel movements (incontinence). CAUSES  The fungus that most often causes cutaneous candidiasis is Candida albicans. Conditions that can increase the risk of getting a yeast infection of the skin include:  Obesity.  Pregnancy.  Diabetes.  Taking antibiotic medicine.  Taking birth control pills.  Taking steroid medicines.  Thyroid disease.  An iron or zinc deficiency.  Problems with the immune system. SYMPTOMS   Red, swollen area of the skin.  Bumps on the skin.  Itchiness. DIAGNOSIS  The diagnosis of cutaneous candidiasis is usually based on its appearance. Light scrapings of the skin may also be taken and viewed under a microscope to identify the presence of yeast. TREATMENT  Antifungal creams may be applied to the infected skin. In severe cases, oral medicines may be needed.  HOME CARE INSTRUCTIONS   Keep your skin clean and dry.  Maintain a healthy weight.  If you have diabetes, keep your blood sugar under control. SEEK IMMEDIATE MEDICAL CARE IF:  Your rash continues to spread despite treatment.  You have a fever, chills, or abdominal pain. Document Released: 02/10/2011 Document Revised: 08/17/2011 Document Reviewed: 02/10/2011 ExitCare Patient Information 2015 [employer]. This information is not intended to replace advice given to you by your health care provider. Make sure you discuss any questions you have with your health care provider.  

## 2014-04-13 NOTE — ED Notes (Addendum)
Pt refusing to urinate d/t burning, states she wants to be cathed.

## 2014-04-13 NOTE — ED Notes (Signed)
The patient is unable to give an urine specimen at this time. The tech has reported to the RN in charge. 

## 2014-04-14 LAB — GC/CHLAMYDIA PROBE AMP
CT PROBE, AMP APTIMA: POSITIVE — AB
GC Probe RNA: NEGATIVE

## 2014-04-15 ENCOUNTER — Telehealth: Payer: Self-pay | Admitting: Emergency Medicine

## 2014-04-15 NOTE — Telephone Encounter (Signed)
Positive Chlamydia culture Chart sent to EDP for review 

## 2014-04-16 ENCOUNTER — Telehealth (HOSPITAL_COMMUNITY): Payer: Self-pay

## 2014-04-16 NOTE — ED Notes (Signed)
Pt returned call. Verified ID. States she has been treated for chlamydia at [facility]. She received azithromycin 1000mg  po. [facility] Faxed

## 2014-04-16 NOTE — ED Notes (Signed)
Chart returned from ED office. Attempted contact x1. No answer. Pt needs zithromax 1 gram po x 1 ordered by Fayrene HelperBowie Tran

## 2015-01-20 IMAGING — CT CT HEAD W/O CM
2 series · 16 of 30 positions shown, 20 images · non-contrast
Comparison: None.

CLINICAL DATA: Chest pain, dizziness

EXAM:
CT HEAD WITHOUT CONTRAST
TECHNIQUE: Contiguous axial images were obtained from the base of the skull
through the vertex without intravenous contrast.

[Series 2: head w/o · axial · non-contrast · 0.42mm/px · z∈[+80,+210]mm · 13 of 32 slices shown, 17 images]
[im 3/32  brain]
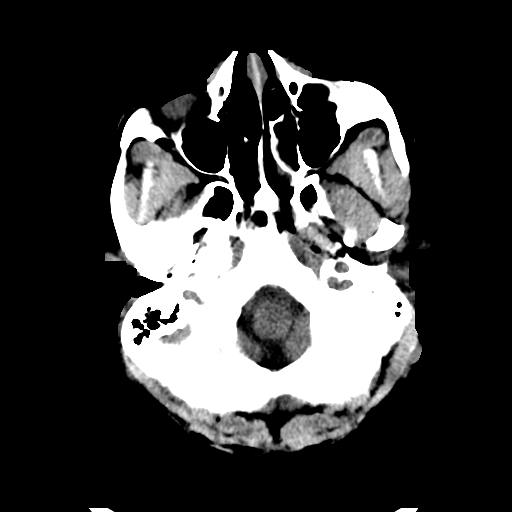
[im 3/32  bone]
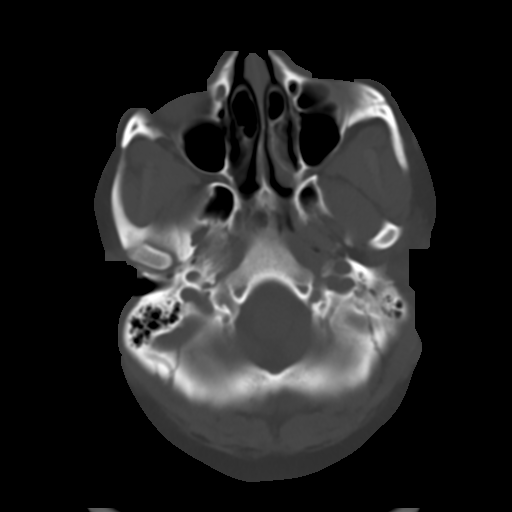
[im 5/32  brain]
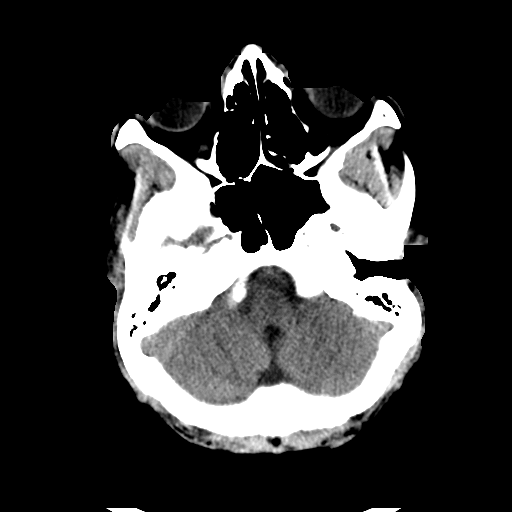
[im 7/32  brain]
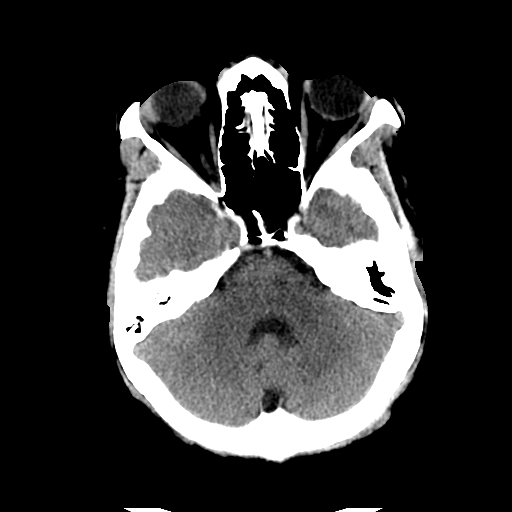
[im 9/32  brain]
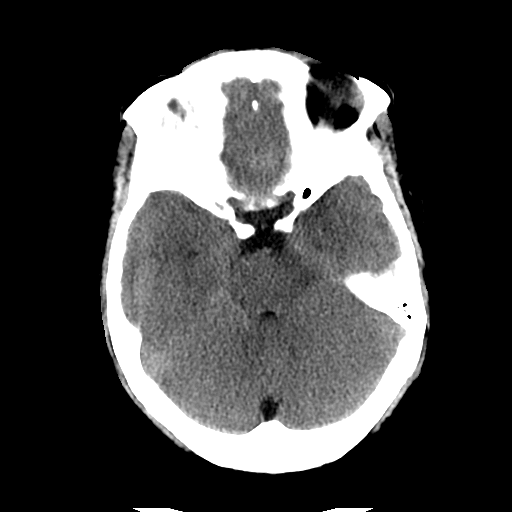
[im 12/32  brain]
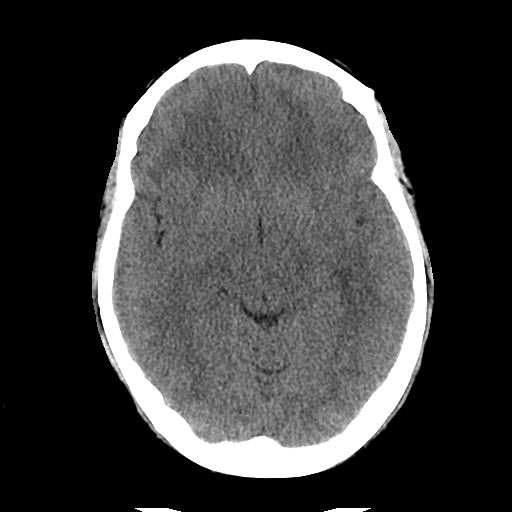
[im 12/32  bone]
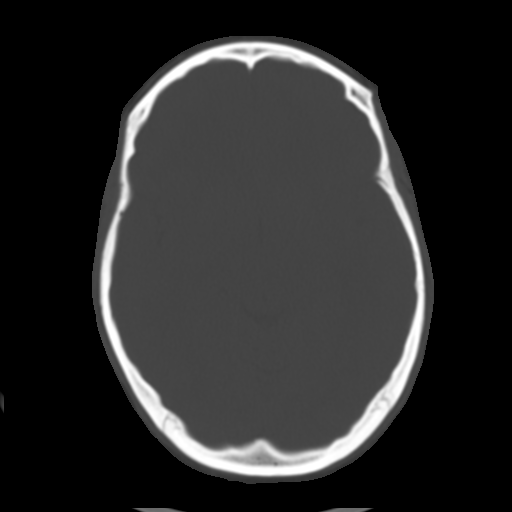
[im 14/32  brain]
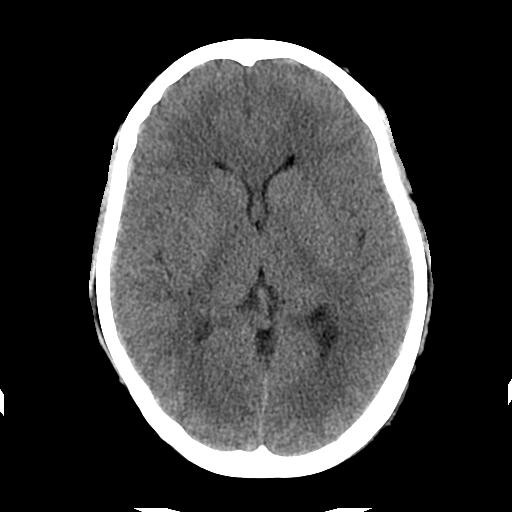
[im 16/32  brain]
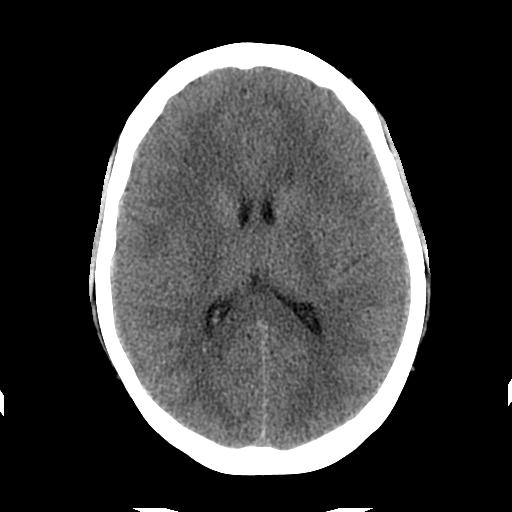
[im 18/32  brain]
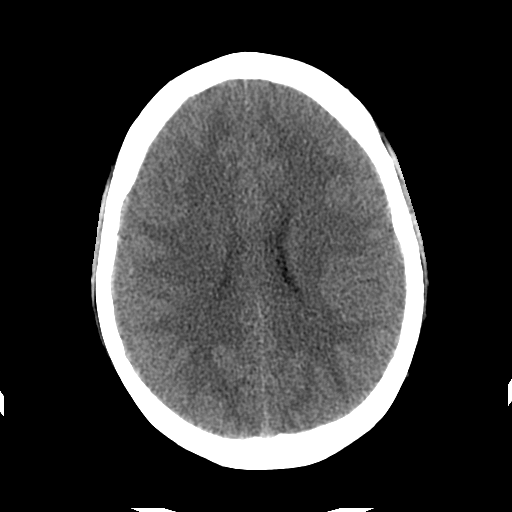
[im 20/32  brain]
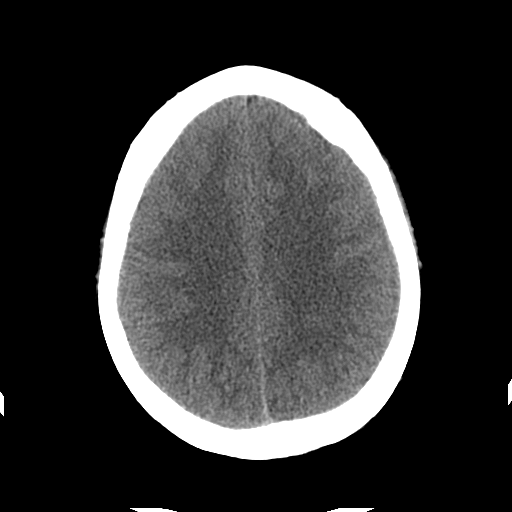
[im 20/32  bone]
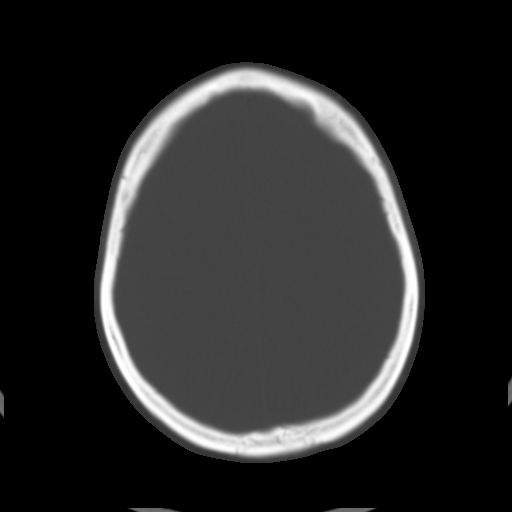
[im 23/32  brain]
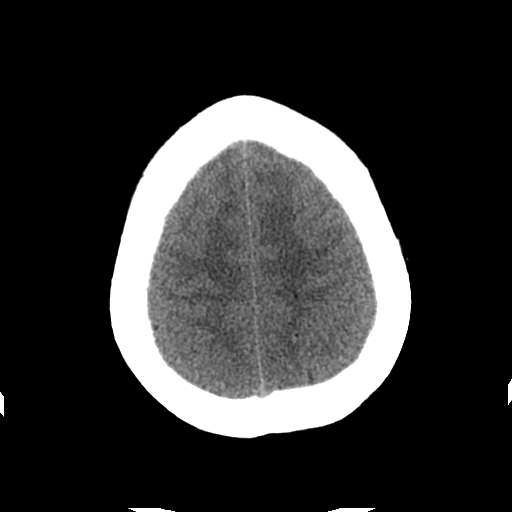
[im 25/32  brain]
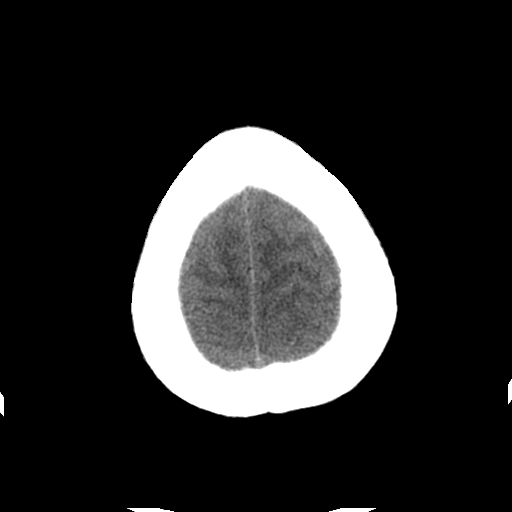
[im 27/32  brain]
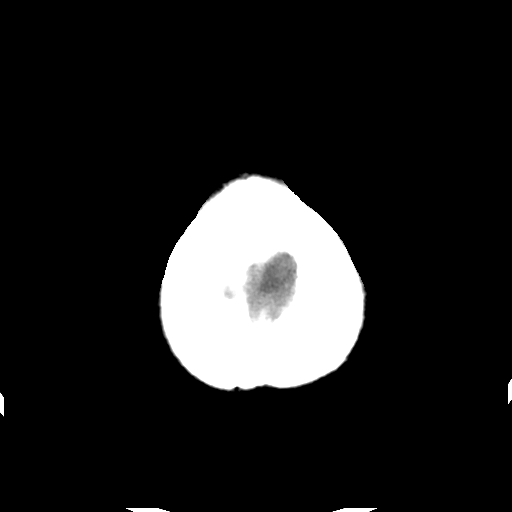
[im 29/32  brain]
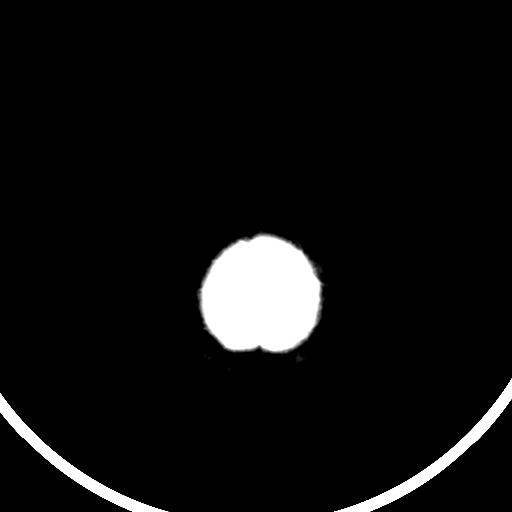
[im 29/32  bone]
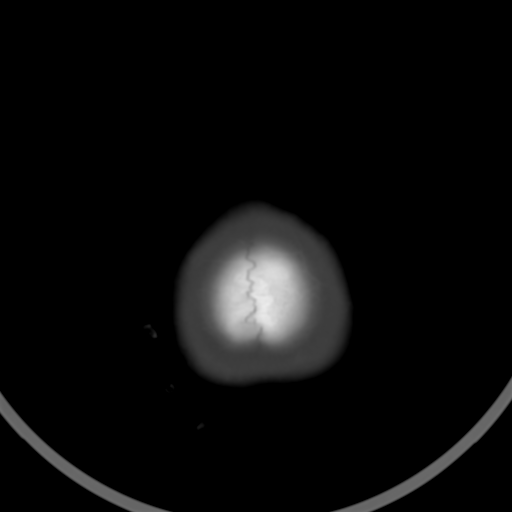

[Series 3: head w/o bone · axial · non-contrast · 0.42mm/px · z∈[+80,+125]mm · 3 of 32 slices shown]
[im 3/32  bone]
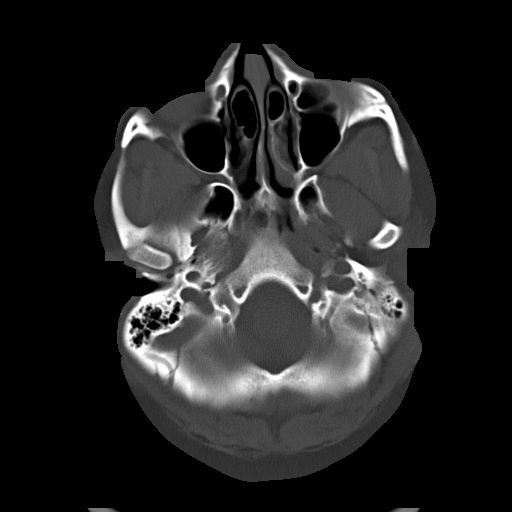
[im 7/32  bone]
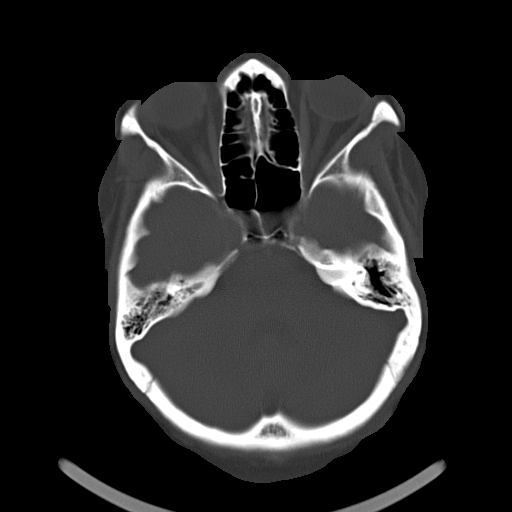
[im 12/32  bone]
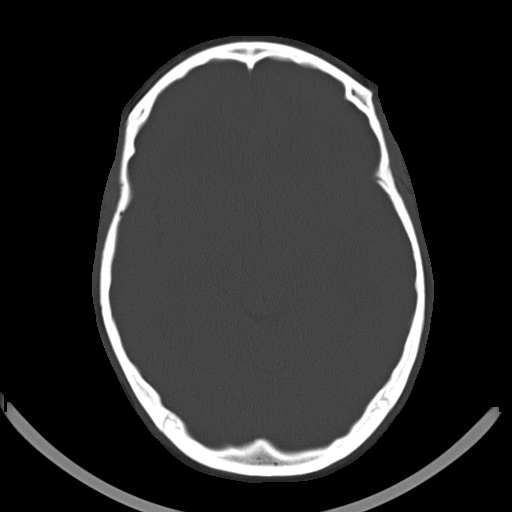

[16 of 30 positions shown; findings below may reference images not displayed]

FINDINGS: No skull fracture is noted. Paranasal sinuses and mastoid air cells
are unremarkable. No intracranial hemorrhage, mass effect or midline
shift.

No hydrocephalus. No mass lesion is noted on this unenhanced scan.
The gray and white-matter differentiation is preserved.
IMPRESSION: No acute intracranial abnormality.
